# Patient Record
Sex: Male | Born: 1980 | Race: Black or African American | Hispanic: No | Marital: Single
Health system: Southern US, Community
[De-identification: ages and names within clinical notes are randomized; demographics above are authoritative.]

---

## 2016-04-29 ENCOUNTER — Emergency Department (HOSPITAL_COMMUNITY)
Admission: EM | Admit: 2016-04-29 | Discharge: 2016-04-30 | Disposition: A | Discharge status: Home / Self Care | Payer: Self-pay | Attending: Emergency Medicine | Admitting: Emergency Medicine

## 2016-04-29 ENCOUNTER — Emergency Department (HOSPITAL_COMMUNITY): Payer: Self-pay

## 2016-04-29 DIAGNOSIS — Y929 Unspecified place or not applicable: Secondary | ICD-10-CM | POA: Insufficient documentation

## 2016-04-29 DIAGNOSIS — G43809 Other migraine, not intractable, without status migrainosus: Secondary | ICD-10-CM | POA: Insufficient documentation

## 2016-04-29 DIAGNOSIS — Y999 Unspecified external cause status: Secondary | ICD-10-CM | POA: Insufficient documentation

## 2016-04-29 DIAGNOSIS — R55 Syncope and collapse: Secondary | ICD-10-CM

## 2016-04-29 DIAGNOSIS — Y939 Activity, unspecified: Secondary | ICD-10-CM | POA: Insufficient documentation

## 2016-04-29 DIAGNOSIS — F419 Anxiety disorder, unspecified: Secondary | ICD-10-CM | POA: Insufficient documentation

## 2016-04-29 DIAGNOSIS — W228XXA Striking against or struck by other objects, initial encounter: Secondary | ICD-10-CM | POA: Insufficient documentation

## 2016-04-29 LAB — BASIC METABOLIC PANEL
Anion gap: 9 (ref 5–15)
BUN: 8 mg/dL (ref 6–20)
CALCIUM: 8.9 mg/dL (ref 8.9–10.3)
CO2: 25 mmol/L (ref 22–32)
CREATININE: 0.91 mg/dL (ref 0.61–1.24)
Chloride: 105 mmol/L (ref 101–111)
GFR calc Af Amer: >60 mL/min (ref >60)
GFR calc non Af Amer: >60 mL/min (ref >60)
GLUCOSE: 93 mg/dL (ref 65–99)
Potassium: 3.7 mmol/L (ref 3.5–5.1)
Sodium: 139 mmol/L (ref 135–145)

## 2016-04-29 MED ORDER — DEXAMETHASONE SODIUM PHOSPHATE 10 MG/ML IJ SOLN
10.0000 mg | Freq: Once | INTRAMUSCULAR | Status: AC
  Administered 2016-04-29: 10 mg via INTRAVENOUS
  Filled 2016-04-29: qty 1

## 2016-04-29 MED ORDER — DIPHENHYDRAMINE HCL 50 MG/ML IJ SOLN
25.0000 mg | Freq: Once | INTRAMUSCULAR | Status: AC
  Administered 2016-04-29: 25 mg via INTRAVENOUS
  Filled 2016-04-29: qty 1

## 2016-04-29 MED ORDER — PROCHLORPERAZINE EDISYLATE 5 MG/ML IJ SOLN
10.0000 mg | Freq: Once | INTRAMUSCULAR | Status: AC
  Administered 2016-04-29: 10 mg via INTRAVENOUS
  Filled 2016-04-29: qty 2

## 2016-04-29 NOTE — ED Notes (Signed)
Patient transported to CT 

## 2016-04-29 NOTE — ED Notes (Signed)
ED Provider at bedside. 

## 2016-04-29 NOTE — ED Triage Notes (Signed)
Pt BIB [facility] from home where family reports that pt had a normal day and had gone to take a nap. When family went to check on pt they report finding him army crawling on his stomach and hyperventilating. Family reports that someone in the home had O2 and they applied it and called EMS. Family also reports that pt has had trouble with anxiety and acclimating since getting out of jail one year ago.

## 2016-04-29 NOTE — ED Provider Notes (Signed)
[facility] DEPT Provider Note   CSN: [id number] Arrival date & time: 04/29/16  2116     History   Chief Complaint Chief Complaint  Patient presents with  . Altered Mental Status    HPI Joshua Mcclain is a 36 y.o. male.  The history is provided by the patient and a relative.  Loss of Consciousness   This is a new problem. The current episode started 1 to 2 hours ago. Episode frequency: once. The problem has been resolved. He lost consciousness for a period of greater than 5 minutes. The problem is associated with an unknown factor. Associated symptoms include headaches, light-headedness and nausea. Pertinent negatives include abdominal pain, back pain, bladder incontinence, chest pain, confusion, congestion, dizziness, fever, focal weakness, malaise/fatigue, palpitations, seizures, slurred speech, vertigo, visual change, vomiting and weakness. Associated symptoms comments: And SOB with BUE tingling. Per pt . He has tried nothing for the symptoms. His past medical history does not include CAD, CVA, DM, seizures, TIA or vertigo.   Found down by family at 7:40pm. Last known normal 7:15 pm. No prior complaints. Had been have mild URI early in the week and diarrhea yesterday w/o emesis.   Per family patient has been in a significant amount of stress recently. He was released from prison about a year ago where he spent approximately 20 years of his life. They report that he's had a difficult time adjusting to be in out. They also report that his increased stress was also likely related to losing his job recently and the fact that the patient is having a baby.  Patient agrees with above and is tearful when discussing it.  No past medical history on file.  There are no active problems to display for this patient.   No past surgical history on file.     Home Medications    Prior to Admission medications   Not on File    Family History No family history on file.  Social  History Social History  Substance Use Topics  . Smoking status: Not on file  . Smokeless tobacco: Not on file  . Alcohol use Not on file     Allergies   Patient has no allergy information on record.   Review of Systems Review of Systems  Constitutional: Negative for fever and malaise/fatigue.  HENT: Negative for congestion.   Cardiovascular: Positive for syncope. Negative for chest pain and palpitations.  Gastrointestinal: Positive for nausea. Negative for abdominal pain and vomiting.  Genitourinary: Negative for bladder incontinence.  Musculoskeletal: Negative for back pain.  Neurological: Positive for light-headedness and headaches. Negative for dizziness, vertigo, focal weakness, seizures and weakness.  Psychiatric/Behavioral: Negative for confusion.  Ten systems are reviewed and are negative for acute change except as noted in the HPI    Physical Exam Updated Vital Signs BP 109/62   Pulse (!) 52   Resp 20   SpO2 95%   Physical Exam  Constitutional: He is oriented to person, place, and time. He appears well-developed and well-nourished. No distress.  HENT:  Head: Normocephalic and atraumatic.  Nose: Nose normal.  Eyes: Conjunctivae and EOM are normal. Pupils are equal, round, and reactive to light. Right eye exhibits no discharge. Left eye exhibits no discharge. No scleral icterus.  Neck: Normal range of motion. Neck supple.  Cardiovascular: Normal rate and regular rhythm.  Exam reveals no gallop and no friction rub.   No murmur heard. Pulmonary/Chest: Effort normal and breath sounds normal. No stridor. No respiratory distress. He  has no rales.  Abdominal: Soft. He exhibits no distension. There is no tenderness.  Musculoskeletal: He exhibits no edema or tenderness.  Neurological: He is alert and oriented to person, place, and time.  Moves all extremities and speaks softly but minimally cooperative due to headache.  Skin: Skin is warm and dry. No rash noted. He is  not diaphoretic. No erythema.  Psychiatric: He has a normal mood and affect.  Vitals reviewed.    ED Treatments / Results  Labs (all labs ordered are listed, but only abnormal results are displayed) Labs Reviewed  I-STAT CHEM 8, ED - Abnormal; Notable for the following:       Result Value   Potassium 6.9 (*)    Calcium, Ion 1.04 (*)    All other components within normal limits  BASIC METABOLIC PANEL    EKG  EKG Interpretation  Date/Time:  Sunday April 29 2016 22:53:00 EST Ventricular Rate:  69 PR Interval:    QRS Duration: 82 QT Interval:  417 QTC Calculation: 447 R Axis:   86 Text Interpretation:  Sinus rhythm LAE, consider biatrial enlargement Probable left ventricular hypertrophy No old tracing to compare Confirmed by [facility] MD, Connee Ikner [phone]) on 04/29/2016 11:26:54 PM       Radiology Ct Head Wo Contrast  Result Date: 04/29/2016 CLINICAL DATA:  Patient fell and hit head. EXAM: CT HEAD WITHOUT CONTRAST TECHNIQUE: Contiguous axial images were obtained from the base of the skull through the vertex without intravenous contrast. COMPARISON:  There is no evidence for acute hemorrhage, hydrocephalus, mass lesion, or abnormal extra-axial fluid collection. No definite CT evidence for acute infarction. FINDINGS: Brain: There is no evidence for acute hemorrhage, hydrocephalus, mass lesion, or abnormal extra-axial fluid collection. No definite CT evidence for acute infarction. Vascular: No hyperdense vessel or unexpected calcification. Skull: No evidence for fracture. No worrisome lytic or sclerotic lesion. Sinuses/Orbits: The visualized paranasal sinuses and mastoid air cells are clear. Visualized portions of the globes and intraorbital fat are unremarkable. Other: None. IMPRESSION: No acute intracranial abnormality. Electronically Signed   By: Kennith Center M.D.   On: 04/29/2016 23:19    Procedures Procedures (including critical care time)  Medications Ordered in ED Medications    diphenhydrAMINE (BENADRYL) injection 25 mg (25 mg Intravenous Given 04/29/16 2222)  dexamethasone (DECADRON) injection 10 mg (10 mg Intravenous Given 04/29/16 2221)  prochlorperazine (COMPAZINE) injection 10 mg (10 mg Intravenous Given 04/29/16 2222)     Initial Impression / Assessment and Plan / ED Course  I have reviewed the triage vital signs and the nursing notes.  Pertinent labs & imaging results that were available during my care of the patient were reviewed by me and considered in my medical decision making (see chart for details).     1. Syncope With associated headache and report of possible panick attack. CT head without evidence of intracranial hemorrhage or mass.  EKG: Normal sinus rhythm, intervals, axis. No evidence of acute ischemia, arrhythmias, brugada, epsilon waves or blocks. No electrolyte derangements some BMP. Likely secondary to hyperventilating.  2. Migraine  CT head negative as above. Treated with migraine cocktail which hasn't significant improvement in his symptomatology. Following treatment patient moving all extremities, speaking in ambulating.  The patient is safe for discharge with strict return precautions.    Final Clinical Impressions(s) / ED Diagnoses   Final diagnoses:  Other migraine without status migrainosus, not intractable  Syncope, unspecified syncope type  Anxiety   Disposition: Discharge  Condition: Good  I  have discussed the results, Dx and Tx plan with the patient who expressed understanding and agree(s) with the plan. Discharge instructions discussed at great length. The patient was given strict return precautions who verbalized understanding of the instructions. No further questions at time of discharge.    New Prescriptions   No medications on file    Follow Up: primary care provider   for referral to Neurology for migraines and psychiatry for anxiety and adjustment disorder      Nira Conn, MD 04/30/16  [phone]

## 2016-04-29 NOTE — ED Notes (Signed)
Family at bedside states that after waking up the first time pt attempted to get up and then fell again hitting his head. Family also reports Hx of migraines but states that any other history is unknown by family r/t pt being in prison.

## 2016-04-30 LAB — I-STAT CHEM 8, ED
BUN: 14 mg/dL (ref 6–20)
CALCIUM ION: 1.04 mmol/L — AB (ref 1.15–1.40)
CREATININE: 0.9 mg/dL (ref 0.61–1.24)
Chloride: 104 mmol/L (ref 101–111)
GLUCOSE: 88 mg/dL (ref 65–99)
HCT: 44 % (ref 39.0–52.0)
HEMOGLOBIN: 15 g/dL (ref 13.0–17.0)
Potassium: 6.9 mmol/L (ref 3.5–5.1)
Sodium: 139 mmol/L (ref 135–145)
TCO2: 29 mmol/L (ref 0–100)

## 2017-09-04 IMAGING — CT CT HEAD W/O CM
3 of 4 series · 15 of 47 positions shown, 18 images · non-contrast
Comparison: There is no evidence for acute hemorrhage,
hydrocephalus, mass lesion, or abnormal extra-axial fluid
collection. No definite CT evidence for acute infarction.

CLINICAL DATA: Patient fell and hit head.

EXAM:
CT HEAD WITHOUT CONTRAST
TECHNIQUE: Contiguous axial images were obtained from the base of the skull
through the vertex without intravenous contrast.

[Series 3: head 2.0 h70h · axial · 0.43mm/px · z∈[-68,+68]mm · 9 of 85 slices shown, 12 images]
[im 9/85  brain]
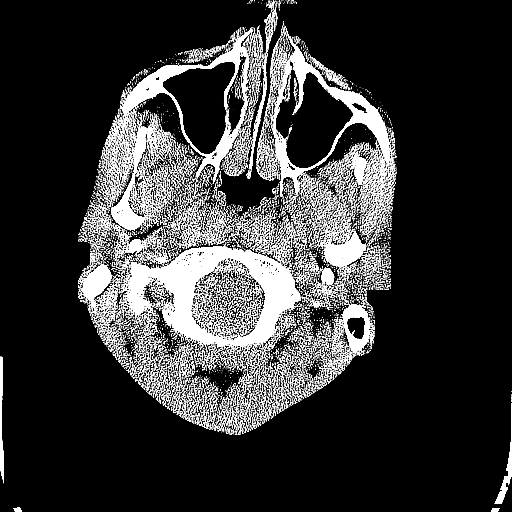
[im 9/85  bone]
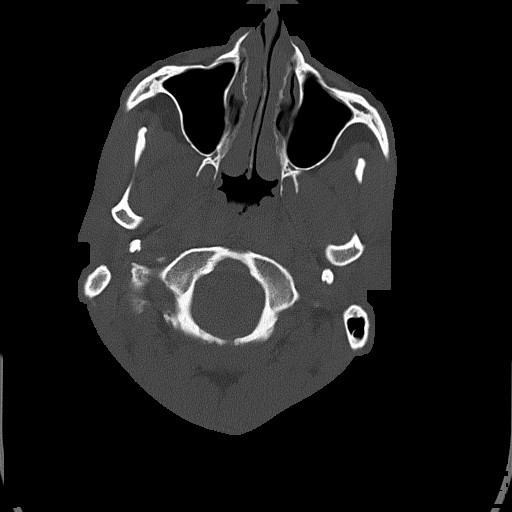
[im 17/85  brain]
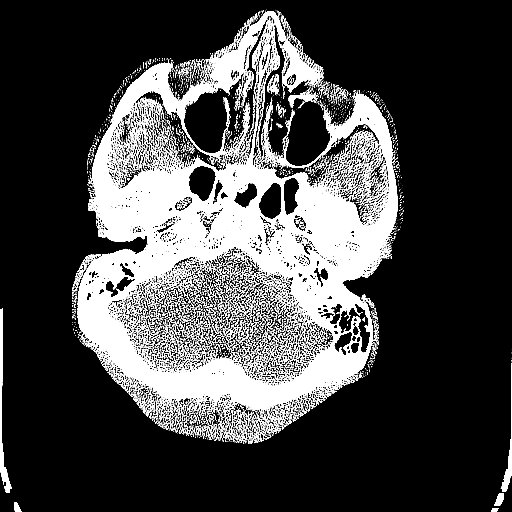
[im 25/85  brain]
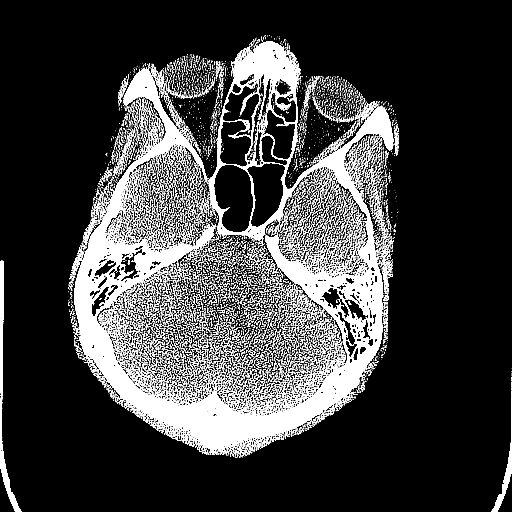
[im 33/85  brain]
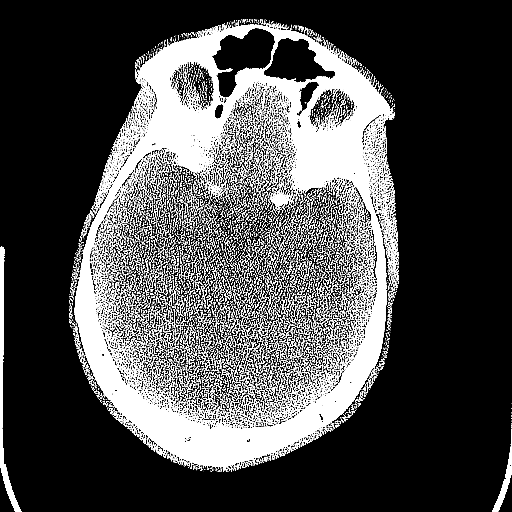
[im 45/85  brain]
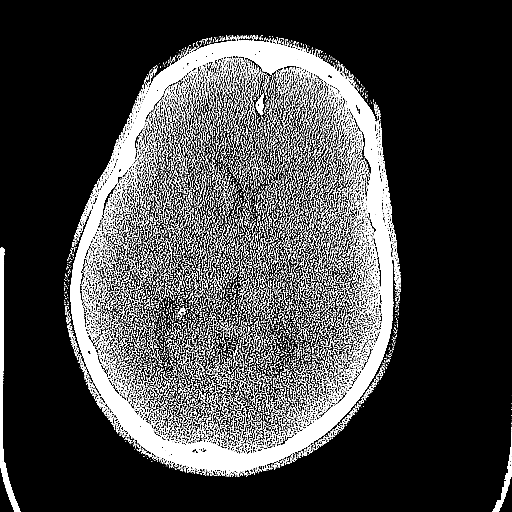
[im 45/85  bone]
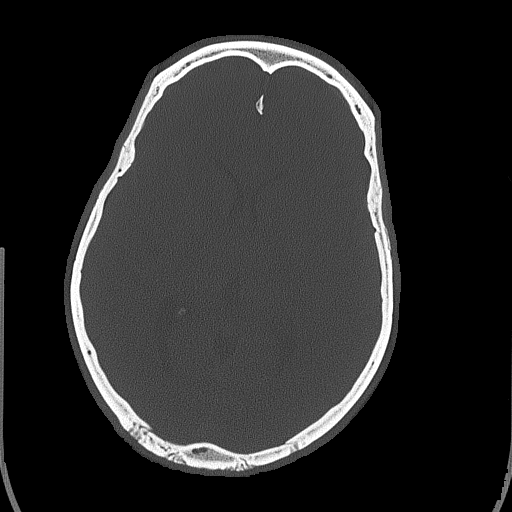
[im 53/85  brain]
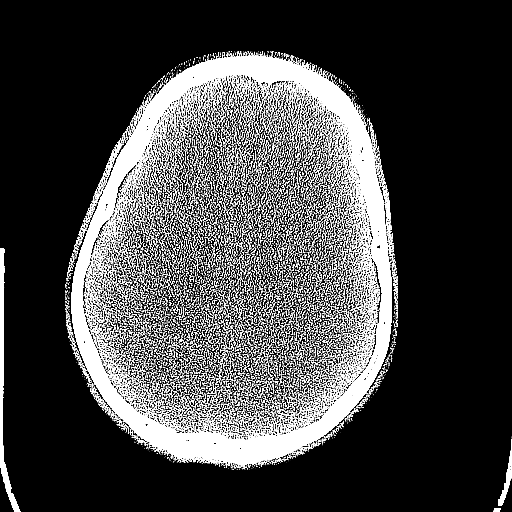
[im 61/85  brain]
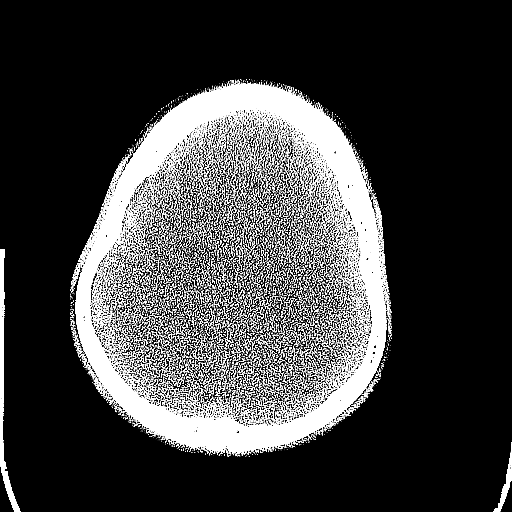
[im 69/85  brain]
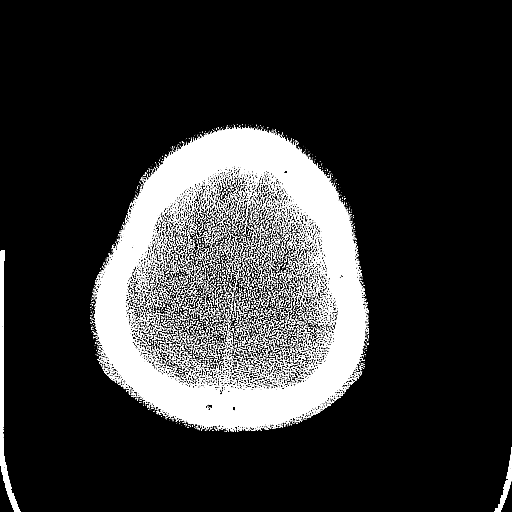
[im 77/85  brain]
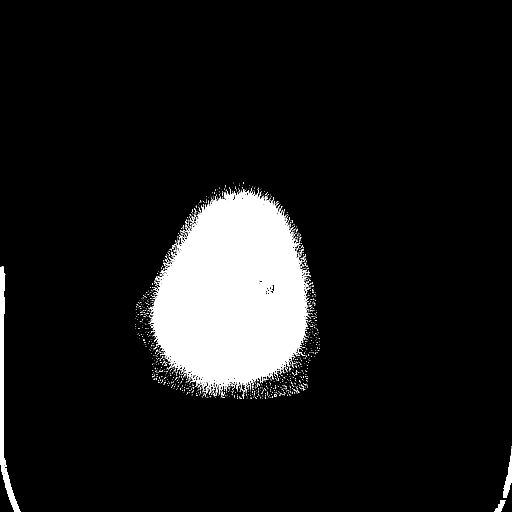
[im 77/85  bone]
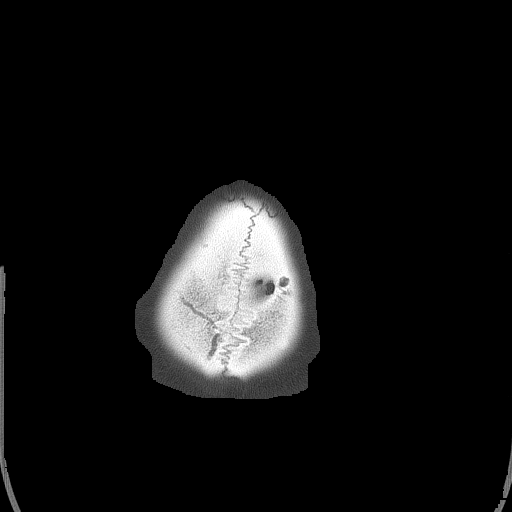

[Series 4: head 3.0 mpr cor · coronal · 0.33mm/px · 3 of 71 slices shown]
[im 24/71  brain]
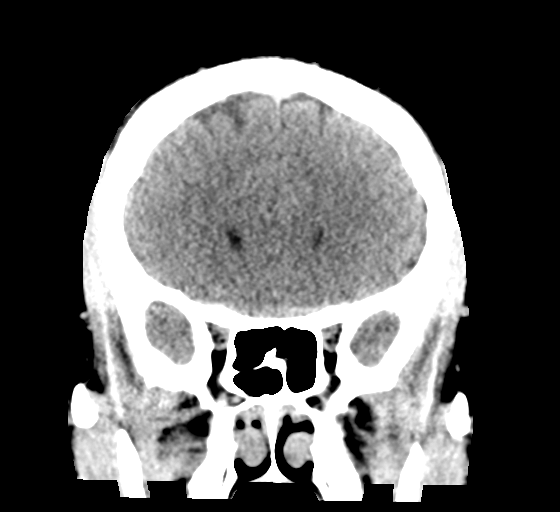
[im 32/71  brain]
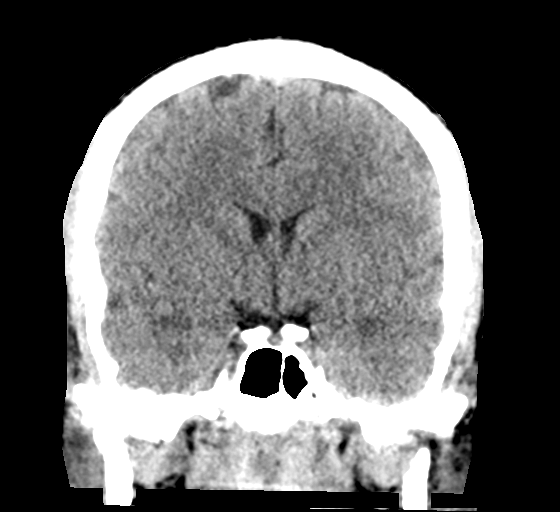
[im 39/71  brain]
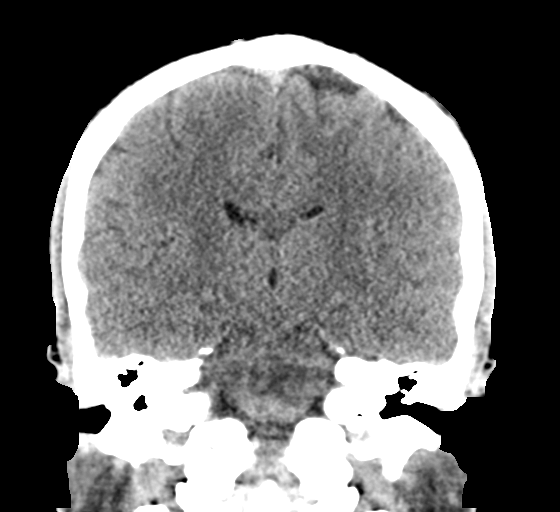

[Series 5: head 3.0 mpr sag · sagittal · 0.33mm/px · 3 of 58 slices shown]
[im 20/58  brain]
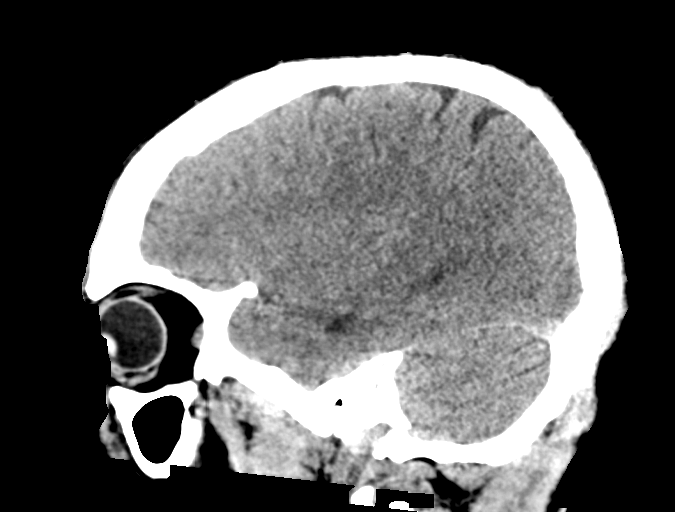
[im 29/58  brain]
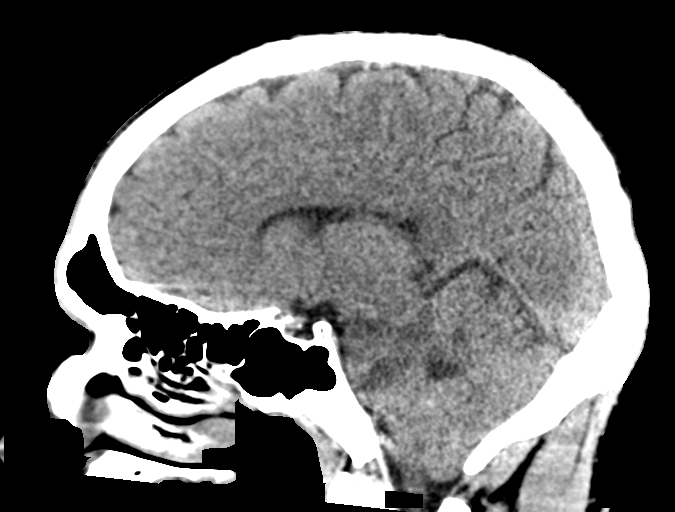
[im 39/58  brain]
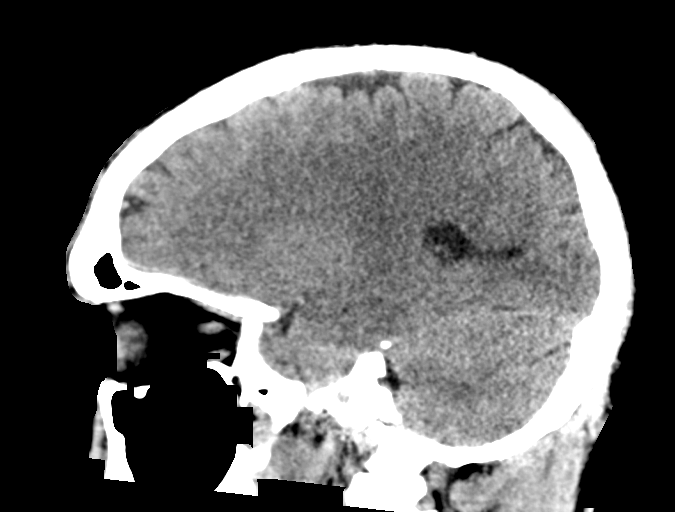

[15 of 47 positions shown; findings below may reference images not displayed]

FINDINGS: Brain: There is no evidence for acute hemorrhage, hydrocephalus,
mass lesion, or abnormal extra-axial fluid collection. No definite
CT evidence for acute infarction.

Vascular: No hyperdense vessel or unexpected calcification.

Skull: No evidence for fracture. No worrisome lytic or sclerotic
lesion.

Sinuses/Orbits: The visualized paranasal sinuses and mastoid air
cells are clear. Visualized portions of the globes and intraorbital
fat are unremarkable.

Other: None.
IMPRESSION: No acute intracranial abnormality.
